# Patient Record
Sex: Female | Born: 1960 | Race: White | Hispanic: No | Marital: Single | Smoking: Never smoker
Health system: Southern US, Community
[De-identification: ages and names within clinical notes are randomized; demographics above are authoritative.]

---

## 1999-04-17 ENCOUNTER — Other Ambulatory Visit: Admission: RE | Admit: 1999-04-17 | Discharge: 1999-04-17 | Payer: Self-pay | Admitting: *Deleted

## 2000-04-01 ENCOUNTER — Other Ambulatory Visit: Admission: RE | Admit: 2000-04-01 | Discharge: 2000-04-01 | Payer: Self-pay | Admitting: Orthopedic Surgery

## 2000-07-07 ENCOUNTER — Ambulatory Visit (HOSPITAL_COMMUNITY): Admission: RE | Admit: 2000-07-07 | Discharge: 2000-07-07 | Payer: Self-pay | Admitting: Neurosurgery

## 2000-07-07 ENCOUNTER — Encounter: Payer: Self-pay | Admitting: Neurosurgery

## 2001-06-21 ENCOUNTER — Other Ambulatory Visit: Admission: RE | Admit: 2001-06-21 | Discharge: 2001-06-21 | Payer: Self-pay | Admitting: Obstetrics & Gynecology

## 2003-01-17 ENCOUNTER — Encounter: Payer: Self-pay | Admitting: Obstetrics & Gynecology

## 2003-01-17 ENCOUNTER — Encounter: Admission: RE | Admit: 2003-01-17 | Discharge: 2003-01-17 | Payer: Self-pay | Admitting: Obstetrics & Gynecology

## 2003-01-31 ENCOUNTER — Other Ambulatory Visit: Admission: RE | Admit: 2003-01-31 | Discharge: 2003-01-31 | Payer: Self-pay | Admitting: Obstetrics & Gynecology

## 2004-03-18 ENCOUNTER — Encounter: Admission: RE | Admit: 2004-03-18 | Discharge: 2004-03-18 | Payer: Self-pay | Admitting: Obstetrics & Gynecology

## 2005-05-14 ENCOUNTER — Encounter: Admission: RE | Admit: 2005-05-14 | Discharge: 2005-05-14 | Payer: Self-pay | Admitting: Obstetrics & Gynecology

## 2006-05-17 ENCOUNTER — Encounter: Admission: RE | Admit: 2006-05-17 | Discharge: 2006-05-17 | Payer: Self-pay | Admitting: Obstetrics & Gynecology

## 2006-07-12 HISTORY — PX: BACK SURGERY: SHX140

## 2007-05-22 ENCOUNTER — Encounter: Admission: RE | Admit: 2007-05-22 | Discharge: 2007-05-22 | Payer: Self-pay | Admitting: Obstetrics & Gynecology

## 2008-05-24 ENCOUNTER — Encounter: Admission: RE | Admit: 2008-05-24 | Discharge: 2008-05-24 | Payer: Self-pay | Admitting: Obstetrics & Gynecology

## 2009-05-28 ENCOUNTER — Encounter: Admission: RE | Admit: 2009-05-28 | Discharge: 2009-05-28 | Payer: Self-pay | Admitting: Obstetrics & Gynecology

## 2010-06-15 ENCOUNTER — Encounter: Admission: RE | Admit: 2010-06-15 | Discharge: 2010-06-15 | Payer: Self-pay | Admitting: Obstetrics & Gynecology

## 2010-11-27 NOTE — Op Note (Signed)
[place]  Patient:    Kendra Foster, Kendra Foster                          MRN: [id number] Proc. Date: 07/07/00 Adm. Date:  [id number] Attending:  Gerald Dexter                           Operative Report  PREOPERATIVE DIAGNOSIS:  Herniated disk, L5-S1 left.  POSTOPERATIVE DIAGNOSIS:  Herniated disk, L5-S1 left.  OPERATION PERFORMED:  Left L5-S1 interlaminar laminotomy for excision of herniated disk with the operating microscope.  SECONDARY PROCEDURE:  Microdissection of L5-S1 disk and S1 nerve root.  SURGEON:  Reinaldo Meeker, M.D.  ASSISTANT:  Donzetta Sprung. Roney Jaffe., M.D.  ANESTHESIA:  DESCRIPTION OF PROCEDURE:  After being placed in prone position, the patients back was prepped and draped in the usual sterile fashion.  A localizing x-ray was taken prior incision to identify the L5-S1 level.  A midline incision made above the spinous processes of L5 and S1.  Using Bovie cutting current the incision was carried down to the spinous processes.  Subperiosteal dissection was then carried out along the left-sided spinous processes of the lamina and the McCullough self-retaining retractor was placed for exposure.  A second x-ray was taken to confirm approach to the L5-S1 level and this was correct. Using a high speed drill the inferior one third of the L5 lamina and medial one third of the facet joint were removed.  Drill was then used for the superior one third of the S2 lamina.  Residual bone and ligamentum flavum were removed in a piece meal fashion.  The microscope was draped and brought into the field and used for the remainder of the case.  Using microdissection technique, the lateral ____________ thecal sac and S1 nerve root were identified.  Further coagulation was carried out down to the floor of the canal to identify the L5-S1 disk which was found to be markedly herniated. After coagulating the annulus, the annulus was incised with a 15 blade.   Using pituitary rongeurs and curets, a very thorough disk space clean out was carried out.  At the same time great care was taken to avoid injury to the neural elements and this was successfully done.  When all the disk material had been removed that could be removed, inspection was carried out in all direction for any evidence of residual compression and none could be identified.  Large amounts of irrigation were carried out and any bleeding controlled with bipolar coagulation and Gelfoam.  The wound was then closed using interrupted Vicryl in the muscle and fascia, subcutaneous and subcuticular tissues and staples on the skin.  A sterile dressing was then applied.  Patient was extubated and taken to the recovery room in stable condition. DD:  07/07/00 TD:  07/07/00 Job: [id number]

## 2011-05-26 ENCOUNTER — Other Ambulatory Visit: Payer: Self-pay | Admitting: Obstetrics & Gynecology

## 2011-05-26 DIAGNOSIS — Z1231 Encounter for screening mammogram for malignant neoplasm of breast: Secondary | ICD-10-CM

## 2011-06-17 ENCOUNTER — Ambulatory Visit
Admission: RE | Admit: 2011-06-17 | Discharge: 2011-06-17 | Disposition: A | Discharge status: Home / Self Care | Payer: PRIVATE HEALTH INSURANCE | Source: Ambulatory Visit | Attending: Obstetrics & Gynecology | Admitting: Obstetrics & Gynecology

## 2011-06-17 DIAGNOSIS — Z1231 Encounter for screening mammogram for malignant neoplasm of breast: Secondary | ICD-10-CM

## 2012-05-26 ENCOUNTER — Other Ambulatory Visit: Payer: Self-pay | Admitting: Obstetrics & Gynecology

## 2012-05-26 DIAGNOSIS — Z1231 Encounter for screening mammogram for malignant neoplasm of breast: Secondary | ICD-10-CM

## 2012-05-30 ENCOUNTER — Ambulatory Visit
Admission: RE | Admit: 2012-05-30 | Discharge: 2012-05-30 | Disposition: A | Discharge status: Home / Self Care | Payer: PRIVATE HEALTH INSURANCE | Source: Ambulatory Visit | Attending: Obstetrics & Gynecology | Admitting: Obstetrics & Gynecology

## 2012-05-30 DIAGNOSIS — Z1231 Encounter for screening mammogram for malignant neoplasm of breast: Secondary | ICD-10-CM

## 2012-07-19 ENCOUNTER — Other Ambulatory Visit: Payer: Self-pay | Admitting: Obstetrics & Gynecology

## 2012-07-19 DIAGNOSIS — Z1231 Encounter for screening mammogram for malignant neoplasm of breast: Secondary | ICD-10-CM

## 2012-07-24 ENCOUNTER — Ambulatory Visit
Admission: RE | Admit: 2012-07-24 | Discharge: 2012-07-24 | Disposition: A | Discharge status: Home / Self Care | Payer: PRIVATE HEALTH INSURANCE | Source: Ambulatory Visit | Attending: Obstetrics & Gynecology | Admitting: Obstetrics & Gynecology

## 2012-07-24 DIAGNOSIS — Z1231 Encounter for screening mammogram for malignant neoplasm of breast: Secondary | ICD-10-CM

## 2013-08-02 ENCOUNTER — Other Ambulatory Visit: Payer: Self-pay

## 2013-08-02 DIAGNOSIS — Z1231 Encounter for screening mammogram for malignant neoplasm of breast: Secondary | ICD-10-CM

## 2013-08-20 ENCOUNTER — Ambulatory Visit
Admission: RE | Admit: 2013-08-20 | Discharge: 2013-08-20 | Disposition: A | Discharge status: Home / Self Care | Payer: 59 | Source: Ambulatory Visit

## 2013-08-20 DIAGNOSIS — Z1231 Encounter for screening mammogram for malignant neoplasm of breast: Secondary | ICD-10-CM

## 2015-06-11 ENCOUNTER — Other Ambulatory Visit: Payer: Self-pay

## 2015-06-11 DIAGNOSIS — Z1231 Encounter for screening mammogram for malignant neoplasm of breast: Secondary | ICD-10-CM

## 2015-06-26 ENCOUNTER — Ambulatory Visit
Admission: RE | Admit: 2015-06-26 | Discharge: 2015-06-26 | Disposition: A | Discharge status: Home / Self Care | Payer: 59 | Source: Ambulatory Visit

## 2015-06-26 DIAGNOSIS — Z1231 Encounter for screening mammogram for malignant neoplasm of breast: Secondary | ICD-10-CM

## 2016-01-21 DIAGNOSIS — N3091 Cystitis, unspecified with hematuria: Secondary | ICD-10-CM | POA: Diagnosis not present

## 2016-01-21 DIAGNOSIS — N3090 Cystitis, unspecified without hematuria: Secondary | ICD-10-CM | POA: Diagnosis not present

## 2016-03-20 DIAGNOSIS — H1011 Acute atopic conjunctivitis, right eye: Secondary | ICD-10-CM | POA: Diagnosis not present

## 2016-03-20 DIAGNOSIS — J309 Allergic rhinitis, unspecified: Secondary | ICD-10-CM | POA: Diagnosis not present

## 2016-07-13 DIAGNOSIS — R300 Dysuria: Secondary | ICD-10-CM | POA: Diagnosis not present

## 2016-07-13 DIAGNOSIS — N3090 Cystitis, unspecified without hematuria: Secondary | ICD-10-CM | POA: Diagnosis not present

## 2016-08-04 DIAGNOSIS — J0100 Acute maxillary sinusitis, unspecified: Secondary | ICD-10-CM | POA: Diagnosis not present

## 2016-08-12 DIAGNOSIS — H04123 Dry eye syndrome of bilateral lacrimal glands: Secondary | ICD-10-CM | POA: Diagnosis not present

## 2016-09-23 DIAGNOSIS — H5213 Myopia, bilateral: Secondary | ICD-10-CM | POA: Diagnosis not present

## 2016-09-23 DIAGNOSIS — H04123 Dry eye syndrome of bilateral lacrimal glands: Secondary | ICD-10-CM | POA: Diagnosis not present

## 2017-01-26 DIAGNOSIS — S90862A Insect bite (nonvenomous), left foot, initial encounter: Secondary | ICD-10-CM | POA: Diagnosis not present

## 2017-02-24 DIAGNOSIS — J0100 Acute maxillary sinusitis, unspecified: Secondary | ICD-10-CM | POA: Diagnosis not present

## 2017-03-15 ENCOUNTER — Other Ambulatory Visit: Payer: Self-pay | Admitting: Obstetrics & Gynecology

## 2017-03-15 DIAGNOSIS — Z1231 Encounter for screening mammogram for malignant neoplasm of breast: Secondary | ICD-10-CM

## 2017-04-04 ENCOUNTER — Ambulatory Visit
Admission: RE | Admit: 2017-04-04 | Discharge: 2017-04-04 | Disposition: A | Discharge status: Home / Self Care | Payer: BLUE CROSS/BLUE SHIELD | Source: Ambulatory Visit | Attending: Obstetrics & Gynecology | Admitting: Obstetrics & Gynecology

## 2017-04-04 DIAGNOSIS — Z1231 Encounter for screening mammogram for malignant neoplasm of breast: Secondary | ICD-10-CM | POA: Diagnosis not present

## 2017-04-27 ENCOUNTER — Ambulatory Visit (INDEPENDENT_AMBULATORY_CARE_PROVIDER_SITE_OTHER): Payer: BLUE CROSS/BLUE SHIELD | Admitting: Obstetrics & Gynecology

## 2017-04-27 ENCOUNTER — Encounter: Payer: Self-pay | Admitting: Obstetrics & Gynecology

## 2017-04-27 VITALS — BP 132/86 | Ht 68.5 in | Wt 178.0 lb

## 2017-04-27 DIAGNOSIS — Z01419 Encounter for gynecological examination (general) (routine) without abnormal findings: Secondary | ICD-10-CM

## 2017-04-27 DIAGNOSIS — N914 Secondary oligomenorrhea: Secondary | ICD-10-CM | POA: Diagnosis not present

## 2017-04-27 DIAGNOSIS — N951 Menopausal and female climacteric states: Secondary | ICD-10-CM | POA: Diagnosis not present

## 2017-04-27 NOTE — Progress Notes (Signed)
Kendra Foster [birth year] [id number]   History:    56 y.o. G0 Single.  Currently abstinent.  RP:  Established patient presenting for annual gyn exam  HPI:  No menses x 1 year, then had a 5 day menstrual like period 03/24/2017.  Occasional spotting x 1 day.  Pelvic discomfort.  No hot flushes/night sweats currently.  Normal vaginal secretions.  Breasts wnl.  Mictions/BMs wnl.  BMI 26.67.  Past medical history,surgical history, family history and social history were all reviewed and documented in the EPIC chart.  Gynecologic History Patient's last menstrual period was 03/24/2017. Contraception: abstinence Last Pap: 2017. Results were: normal Last mammogram: 2018. Results were: normal Colono never  Obstetric History OB History  Gravida Para Term Preterm AB Living  0 0 0 0 0 0  SAB TAB Ectopic Multiple Live Births  0 0 0 0 0         ROS: A ROS was performed and pertinent positives and negatives are included in the history.  GENERAL: No fevers or chills. HEENT: No change in vision, no earache, sore throat or sinus congestion. NECK: No pain or stiffness. CARDIOVASCULAR: No chest pain or pressure. No palpitations. PULMONARY: No shortness of breath, cough or wheeze. GASTROINTESTINAL: No abdominal pain, nausea, vomiting or diarrhea, melena or bright red blood per rectum. GENITOURINARY: No urinary frequency, urgency, hesitancy or dysuria. MUSCULOSKELETAL: No joint or muscle pain, no back pain, no recent trauma. DERMATOLOGIC: No rash, no itching, no lesions. ENDOCRINE: No polyuria, polydipsia, no heat or cold intolerance. No recent change in weight. HEMATOLOGICAL: No anemia or easy bruising or bleeding. NEUROLOGIC: No headache, seizures, numbness, tingling or weakness. PSYCHIATRIC: No depression, no loss of interest in normal activity or change in sleep pattern.     Exam:   BP 132/86   Ht 5' 8.5" (1.74 m)   Wt 178 lb (80.7 kg)   LMP 03/24/2017   BMI 26.67 kg/m   Body mass index is  26.67 kg/m.  General appearance : Well developed well nourished female. No acute distress HEENT: Eyes: no retinal hemorrhage or exudates,  Neck supple, trachea midline, no carotid bruits, no thyroidmegaly Lungs: Clear to auscultation, no rhonchi or wheezes, or rib retractions  Heart: Regular rate and rhythm, no murmurs or gallops Breast:Examined in sitting and supine position were symmetrical in appearance, no palpable masses or tenderness,  no skin retraction, no nipple inversion, no nipple discharge, no skin discoloration, no axillary or supraclavicular lymphadenopathy Abdomen: no palpable masses or tenderness, no rebound or guarding Extremities: no edema or skin discoloration or tenderness  Pelvic: Vulva normal  Bartholin, Urethra, Skene Glands: Within normal limits             Vagina: No gross lesions or discharge  Cervix: No gross lesions or discharge.  Pap reflex done.  Uterus  AV, normal size, shape and consistency, non-tender and mobile  Adnexa  Without masses or tenderness  Anus and perineum  normal    Assessment/Plan:  56 y.o. female for annual exam   1. Encounter for routine gynecological examination with Papanicolaou smear of cervix Normal gyn exam.  Pap reflex done.  Breasts wnl.  Mammo normal 2018.  2. Secondary oligomenorrhea Perimenopause or PMB.  F/U Pelvic US to r/o Endometrial pathology/Possible EBx. - US Transvaginal Non-OB; Future  3. Perimenopause Probable Perimenopause.  Check FSH level and f/u Pelvic US for Endometrial line evaluation. - FSH - US Transvaginal Non-OB; Future  Counseling on above issues >50% x 10 minutes.  Princess Bruins MD, 12:17 PM 04/27/2017

## 2017-04-28 LAB — PAP IG W/ RFLX HPV ASCU

## 2017-04-28 LAB — FOLLICLE STIMULATING HORMONE: FSH: 84.3 m[IU]/mL

## 2017-05-01 NOTE — Patient Instructions (Addendum)
1. Encounter for routine gynecological examination with Papanicolaou smear of cervix Normal gyn exam.  Pap reflex done.  Breasts wnl.  Mammo normal 2018.  2. Secondary oligomenorrhea Perimenopause or PMB.  F/U Pelvic US to r/o Endometrial pathology/Possible EBx. - US Transvaginal Non-OB; Future  3. Perimenopause Probable Perimenopause.  Check FSH level and f/u Pelvic US for Endometrial line evaluation. - FSH - US Transvaginal Non-OB; Future  Kendra Foster, it was a pleasure to see you today!  I will inform you of your results as soon as available and see you again soon for your pelvic US.   Health Maintenance for Postmenopausal Women Menopause is a normal process in which your reproductive ability comes to an end. This process happens gradually over a span of months to years, usually between the ages of 64 and 20. Menopause is complete when you have missed 12 consecutive menstrual periods. It is important to talk with your health care provider about some of the most common conditions that affect postmenopausal women, such as heart disease, cancer, and bone loss (osteoporosis). Adopting a healthy lifestyle and getting preventive care can help to promote your health and wellness. Those actions can also lower your chances of developing some of these common conditions. What should I know about menopause? During menopause, you may experience a number of symptoms, such as:  Moderate-to-severe hot flashes.  Night sweats.  Decrease in sex drive.  Mood swings.  Headaches.  Tiredness.  Irritability.  Memory problems.  Insomnia.  Choosing to treat or not to treat menopausal changes is an individual decision that you make with your health care provider. What should I know about hormone replacement therapy and supplements? Hormone therapy products are effective for treating symptoms that are associated with menopause, such as hot flashes and night sweats. Hormone replacement carries certain  risks, especially as you become older. If you are thinking about using estrogen or estrogen with progestin treatments, discuss the benefits and risks with your health care provider. What should I know about heart disease and stroke? Heart disease, heart attack, and stroke become more likely as you age. This may be due, in part, to the hormonal changes that your body experiences during menopause. These can affect how your body processes dietary fats, triglycerides, and cholesterol. Heart attack and stroke are both medical emergencies. There are many things that you can do to help prevent heart disease and stroke:  Have your blood pressure checked at least every 1-2 years. High blood pressure causes heart disease and increases the risk of stroke.  If you are 93-72 years old, ask your health care provider if you should take aspirin to prevent a heart attack or a stroke.  Do not use any tobacco products, including cigarettes, chewing tobacco, or electronic cigarettes. If you need help quitting, ask your health care provider.  It is important to eat a healthy diet and maintain a healthy weight. ? Be sure to include plenty of vegetables, fruits, low-fat dairy products, and lean protein. ? Avoid eating foods that are high in solid fats, added sugars, or salt (sodium).  Get regular exercise. This is one of the most important things that you can do for your health. ? Try to exercise for at least 150 minutes each week. The type of exercise that you do should increase your heart rate and make you sweat. This is known as moderate-intensity exercise. ? Try to do strengthening exercises at least twice each week. Do these in addition to the moderate-intensity exercise.  Know your numbers.Ask your health care provider to check your cholesterol and your blood glucose. Continue to have your blood tested as directed by your health care provider.  What should I know about cancer screening? There are several types  of cancer. Take the following steps to reduce your risk and to catch any cancer development as early as possible. Breast Cancer  Practice breast self-awareness. ? This means understanding how your breasts normally appear and feel. ? It also means doing regular breast self-exams. Let your health care provider know about any changes, no matter how small.  If you are 51 or older, have a clinician do a breast exam (clinical breast exam or CBE) every year. Depending on your age, family history, and medical history, it may be recommended that you also have a yearly breast X-ray (mammogram).  If you have a family history of breast cancer, talk with your health care provider about genetic screening.  If you are at high risk for breast cancer, talk with your health care provider about having an MRI and a mammogram every year.  Breast cancer (BRCA) gene test is recommended for women who have family members with BRCA-related cancers. Results of the assessment will determine the need for genetic counseling and BRCA1 and for BRCA2 testing. BRCA-related cancers include these types: ? Breast. This occurs in males or females. ? Ovarian. ? Tubal. This may also be called fallopian tube cancer. ? Cancer of the abdominal or pelvic lining (peritoneal cancer). ? Prostate. ? Pancreatic.  Cervical, Uterine, and Ovarian Cancer Your health care provider may recommend that you be screened regularly for cancer of the pelvic organs. These include your ovaries, uterus, and vagina. This screening involves a pelvic exam, which includes checking for microscopic changes to the surface of your cervix (Pap test).  For women ages 21-65, health care providers may recommend a pelvic exam and a Pap test every three years. For women ages 63-65, they may recommend the Pap test and pelvic exam, combined with testing for human papilloma virus (HPV), every five years. Some types of HPV increase your risk of cervical cancer. Testing for  HPV may also be done on women of any age who have unclear Pap test results.  Other health care providers may not recommend any screening for nonpregnant women who are considered low risk for pelvic cancer and have no symptoms. Ask your health care provider if a screening pelvic exam is right for you.  If you have had past treatment for cervical cancer or a condition that could lead to cancer, you need Pap tests and screening for cancer for at least 20 years after your treatment. If Pap tests have been discontinued for you, your risk factors (such as having a new sexual partner) need to be reassessed to determine if you should start having screenings again. Some women have medical problems that increase the chance of getting cervical cancer. In these cases, your health care provider may recommend that you have screening and Pap tests more often.  If you have a family history of uterine cancer or ovarian cancer, talk with your health care provider about genetic screening.  If you have vaginal bleeding after reaching menopause, tell your health care provider.  There are currently no reliable tests available to screen for ovarian cancer.  Lung Cancer Lung cancer screening is recommended for adults 72-24 years old who are at high risk for lung cancer because of a history of smoking. A yearly low-dose CT scan of the  lungs is recommended if you:  Currently smoke.  Have a history of at least 30 pack-years of smoking and you currently smoke or have quit within the past 15 years. A pack-year is smoking an average of one pack of cigarettes per day for one year.  Yearly screening should:  Continue until it has been 15 years since you quit.  Stop if you develop a health problem that would prevent you from having lung cancer treatment.  Colorectal Cancer  This type of cancer can be detected and can often be prevented.  Routine colorectal cancer screening usually begins at age 20 and continues through  age 62.  If you have risk factors for colon cancer, your health care provider may recommend that you be screened at an earlier age.  If you have a family history of colorectal cancer, talk with your health care provider about genetic screening.  Your health care provider may also recommend using home test kits to check for hidden blood in your stool.  A small camera at the end of a tube can be used to examine your colon directly (sigmoidoscopy or colonoscopy). This is done to check for the earliest forms of colorectal cancer.  Direct examination of the colon should be repeated every 5-10 years until age 5. However, if early forms of precancerous polyps or small growths are found or if you have a family history or genetic risk for colorectal cancer, you may need to be screened more often.  Skin Cancer  Check your skin from head to toe regularly.  Monitor any moles. Be sure to tell your health care provider: ? About any new moles or changes in moles, especially if there is a change in a mole's shape or color. ? If you have a mole that is larger than the size of a pencil eraser.  If any of your family members has a history of skin cancer, especially at a young age, talk with your health care provider about genetic screening.  Always use sunscreen. Apply sunscreen liberally and repeatedly throughout the day.  Whenever you are outside, protect yourself by wearing long sleeves, pants, a wide-brimmed hat, and sunglasses.  What should I know about osteoporosis? Osteoporosis is a condition in which bone destruction happens more quickly than new bone creation. After menopause, you may be at an increased risk for osteoporosis. To help prevent osteoporosis or the bone fractures that can happen because of osteoporosis, the following is recommended:  If you are 41-31 years old, get at least 1,000 mg of calcium and at least 600 mg of vitamin D per day.  If you are older than age 43 but younger than  age 44, get at least 1,200 mg of calcium and at least 600 mg of vitamin D per day.  If you are older than age 11, get at least 1,200 mg of calcium and at least 800 mg of vitamin D per day.  Smoking and excessive alcohol intake increase the risk of osteoporosis. Eat foods that are rich in calcium and vitamin D, and do weight-bearing exercises several times each week as directed by your health care provider. What should I know about how menopause affects my mental health? Depression may occur at any age, but it is more common as you become older. Common symptoms of depression include:  Low or sad mood.  Changes in sleep patterns.  Changes in appetite or eating patterns.  Feeling an overall lack of motivation or enjoyment of activities that you previously  enjoyed.  Frequent crying spells.  Talk with your health care provider if you think that you are experiencing depression. What should I know about immunizations? It is important that you get and maintain your immunizations. These include:  Tetanus, diphtheria, and pertussis (Tdap) booster vaccine.  Influenza every year before the flu season begins.  Pneumonia vaccine.  Shingles vaccine.  Your health care provider may also recommend other immunizations. This information is not intended to replace advice given to you by your health care provider. Make sure you discuss any questions you have with your health care provider. Document Released: 08/20/2005 Document Revised: 01/16/2016 Document Reviewed: 04/01/2015 Elsevier Interactive Patient Education  2018 [employer].

## 2017-05-02 ENCOUNTER — Telehealth: Payer: Self-pay | Admitting: *Deleted

## 2017-05-02 NOTE — Telephone Encounter (Signed)
-----   Message from Princess Bruins, MD sent at 04/27/2017 12:33 PM EDT ----- Regarding: Screening Colono Never done.  Schedule screening Colono with Dr Collene Mares if takes her insurance.

## 2017-05-02 NOTE — Telephone Encounter (Signed)
Notes faxed to Dr.Mann office they will contact pt to schedule and fax me back with time and date.

## 2017-05-04 NOTE — Telephone Encounter (Signed)
Left a detailed message on pt voicemail with time and date for appointment per DPR access 05/10/17 @ 2:45pm

## 2017-05-11 ENCOUNTER — Other Ambulatory Visit: Payer: Self-pay | Admitting: Obstetrics & Gynecology

## 2017-05-11 ENCOUNTER — Ambulatory Visit (INDEPENDENT_AMBULATORY_CARE_PROVIDER_SITE_OTHER): Payer: BLUE CROSS/BLUE SHIELD | Admitting: Obstetrics & Gynecology

## 2017-05-11 ENCOUNTER — Ambulatory Visit (INDEPENDENT_AMBULATORY_CARE_PROVIDER_SITE_OTHER): Payer: BLUE CROSS/BLUE SHIELD

## 2017-05-11 DIAGNOSIS — N951 Menopausal and female climacteric states: Secondary | ICD-10-CM

## 2017-05-11 DIAGNOSIS — N950 Postmenopausal bleeding: Secondary | ICD-10-CM | POA: Diagnosis not present

## 2017-05-11 DIAGNOSIS — N914 Secondary oligomenorrhea: Secondary | ICD-10-CM | POA: Diagnosis not present

## 2017-05-11 DIAGNOSIS — D251 Intramural leiomyoma of uterus: Secondary | ICD-10-CM | POA: Diagnosis not present

## 2017-05-11 NOTE — Patient Instructions (Signed)
1. Postmenopausal bleeding Endometrial line thin at 2.0 mm.  Patient reassured.  Uterus/Ovaries wnl.  Will observe, very little symptoms of menopause, declines HRT at this time.  Akiva, good seeing you today!

## 2017-05-11 NOTE — Progress Notes (Signed)
    Kendra Foster October 19, 1960 [id number]        56 y.o.  G0  RP:  PMB for Pelvic US  HPI:  No further bleeding x 03/24/2017.  No pelvic pain.  [facility] last visit on 04/27/2017 at 56.  Past medical history,surgical history, problem list, medications, allergies, family history and social history were all reviewed and documented in the EPIC chart.  Directed ROS with pertinent positives and negatives documented in the history of present illness/assessment and plan.  Exam:  There were no vitals filed for this visit. General appearance:  Normal  Pelvic US today:  T/V Anteverted uterus with heterogeneous myometrium.  Endometrial line at 2.0 mm.  Intramural Fibroids 1.4 x 1.2 cm and 1.6 x 1.4 cm.  Right ovary normal.  Left ovary normal by T/A images.  No FF in CDS.   Assessment/Plan:  56 y.o. G0P0000   1. Postmenopausal bleeding Endometrial line thin at 2.0 mm.  Patient reassured.  Uterus/Ovaries wnl.  Will observe, very little symptoms of menopause, declines HRT at this time.  Counseling on above issues >50% x 15 minutes  Princess Bruins MD, 2:51 PM 05/11/2017

## 2017-07-14 DIAGNOSIS — J0100 Acute maxillary sinusitis, unspecified: Secondary | ICD-10-CM | POA: Diagnosis not present

## 2017-11-21 DIAGNOSIS — J324 Chronic pansinusitis: Secondary | ICD-10-CM | POA: Diagnosis not present

## 2018-05-24 ENCOUNTER — Other Ambulatory Visit: Payer: Self-pay | Admitting: Obstetrics & Gynecology

## 2018-05-24 DIAGNOSIS — Z1231 Encounter for screening mammogram for malignant neoplasm of breast: Secondary | ICD-10-CM

## 2018-05-25 DIAGNOSIS — Z13220 Encounter for screening for lipoid disorders: Secondary | ICD-10-CM | POA: Diagnosis not present

## 2018-05-25 DIAGNOSIS — Z0000 Encounter for general adult medical examination without abnormal findings: Secondary | ICD-10-CM | POA: Diagnosis not present

## 2018-05-25 DIAGNOSIS — Z6828 Body mass index (BMI) 28.0-28.9, adult: Secondary | ICD-10-CM | POA: Diagnosis not present

## 2018-05-25 DIAGNOSIS — Z23 Encounter for immunization: Secondary | ICD-10-CM | POA: Diagnosis not present

## 2018-07-17 ENCOUNTER — Ambulatory Visit
Admission: RE | Admit: 2018-07-17 | Discharge: 2018-07-17 | Disposition: A | Discharge status: Home / Self Care | Payer: BLUE CROSS/BLUE SHIELD | Source: Ambulatory Visit | Attending: Obstetrics & Gynecology | Admitting: Obstetrics & Gynecology

## 2018-07-17 DIAGNOSIS — Z1231 Encounter for screening mammogram for malignant neoplasm of breast: Secondary | ICD-10-CM

## 2018-08-07 DIAGNOSIS — J0100 Acute maxillary sinusitis, unspecified: Secondary | ICD-10-CM | POA: Diagnosis not present

## 2018-10-20 DIAGNOSIS — N3090 Cystitis, unspecified without hematuria: Secondary | ICD-10-CM | POA: Diagnosis not present

## 2018-10-20 DIAGNOSIS — N3001 Acute cystitis with hematuria: Secondary | ICD-10-CM | POA: Diagnosis not present

## 2018-12-25 DIAGNOSIS — L255 Unspecified contact dermatitis due to plants, except food: Secondary | ICD-10-CM | POA: Diagnosis not present

## 2019-01-01 DIAGNOSIS — L255 Unspecified contact dermatitis due to plants, except food: Secondary | ICD-10-CM | POA: Diagnosis not present

## 2019-02-06 DIAGNOSIS — N3001 Acute cystitis with hematuria: Secondary | ICD-10-CM | POA: Diagnosis not present

## 2019-02-06 DIAGNOSIS — R300 Dysuria: Secondary | ICD-10-CM | POA: Diagnosis not present

## 2019-02-21 DIAGNOSIS — H5213 Myopia, bilateral: Secondary | ICD-10-CM | POA: Diagnosis not present

## 2019-04-18 DIAGNOSIS — R05 Cough: Secondary | ICD-10-CM | POA: Diagnosis not present

## 2019-04-18 DIAGNOSIS — Z20828 Contact with and (suspected) exposure to other viral communicable diseases: Secondary | ICD-10-CM | POA: Diagnosis not present

## 2019-04-26 DIAGNOSIS — N3091 Cystitis, unspecified with hematuria: Secondary | ICD-10-CM | POA: Diagnosis not present

## 2019-04-26 DIAGNOSIS — N3000 Acute cystitis without hematuria: Secondary | ICD-10-CM | POA: Diagnosis not present

## 2019-05-22 ENCOUNTER — Other Ambulatory Visit: Payer: Self-pay

## 2019-05-23 ENCOUNTER — Encounter: Payer: Self-pay | Admitting: Obstetrics & Gynecology

## 2019-05-23 ENCOUNTER — Ambulatory Visit (INDEPENDENT_AMBULATORY_CARE_PROVIDER_SITE_OTHER): Payer: BC Managed Care – PPO | Admitting: Obstetrics & Gynecology

## 2019-05-23 VITALS — BP 120/70 | Ht 68.5 in | Wt 165.0 lb

## 2019-05-23 DIAGNOSIS — Z780 Asymptomatic menopausal state: Secondary | ICD-10-CM | POA: Diagnosis not present

## 2019-05-23 DIAGNOSIS — E559 Vitamin D deficiency, unspecified: Secondary | ICD-10-CM | POA: Diagnosis not present

## 2019-05-23 DIAGNOSIS — E7800 Pure hypercholesterolemia, unspecified: Secondary | ICD-10-CM | POA: Diagnosis not present

## 2019-05-23 DIAGNOSIS — E785 Hyperlipidemia, unspecified: Secondary | ICD-10-CM | POA: Diagnosis not present

## 2019-05-23 DIAGNOSIS — Z01419 Encounter for gynecological examination (general) (routine) without abnormal findings: Secondary | ICD-10-CM

## 2019-05-23 NOTE — Progress Notes (Signed)
Kendra Foster 04-28-1961 [id number]   History:    58 y.o. G0 Single  RP:  Established patient presenting for annual gyn exam   HPI: Postmenopausal well on no hormone replacement therapy.  No postmenopausal bleeding for the past 2 years.  No pelvic pain.  Currently abstinent.  Urine and bowel movements normal.  Breasts normal.  Lost 23 Lbs x last visit 2 yrs ago (real wt then per patient was 188).  Body mass index now at 24.72.  Improved fitness with walking and low calorie/carb diet.  Will do fasting health labs here today.  Past medical history,surgical history, family history and social history were all reviewed and documented in the EPIC chart.  Gynecologic History Patient's last menstrual period was 03/24/2017. Contraception: abstinence and post menopausal status Last Pap: 04/2017.  Results were: Negative Last mammogram: 07/2018. Results were: Negative Bone Density: Never Colonoscopy: Never, declines.  Obstetric History OB History  Gravida Para Term Preterm AB Living  0 0 0 0 0 0  SAB TAB Ectopic Multiple Live Births  0 0 0 0 0     ROS: A ROS was performed and pertinent positives and negatives are included in the history.  GENERAL: No fevers or chills. HEENT: No change in vision, no earache, sore throat or sinus congestion. NECK: No pain or stiffness. CARDIOVASCULAR: No chest pain or pressure. No palpitations. PULMONARY: No shortness of breath, cough or wheeze. GASTROINTESTINAL: No abdominal pain, nausea, vomiting or diarrhea, melena or bright red blood per rectum. GENITOURINARY: No urinary frequency, urgency, hesitancy or dysuria. MUSCULOSKELETAL: No joint or muscle pain, no back pain, no recent trauma. DERMATOLOGIC: No rash, no itching, no lesions. ENDOCRINE: No polyuria, polydipsia, no heat or cold intolerance. No recent change in weight. HEMATOLOGICAL: No anemia or easy bruising or bleeding. NEUROLOGIC: No headache, seizures, numbness, tingling or weakness. PSYCHIATRIC: No  depression, no loss of interest in normal activity or change in sleep pattern.     Exam:   BP 120/70   Ht 5' 8.5" (1.74 m)   Wt 165 lb (74.8 kg)   LMP 03/24/2017   BMI 24.72 kg/m   Body mass index is 24.72 kg/m.  General appearance : Well developed well nourished female. No acute distress HEENT: Eyes: no retinal hemorrhage or exudates,  Neck supple, trachea midline, no carotid bruits, no thyroidmegaly Lungs: Clear to auscultation, no rhonchi or wheezes, or rib retractions  Heart: Regular rate and rhythm, no murmurs or gallops Breast:Examined in sitting and supine position were symmetrical in appearance, no palpable masses or tenderness,  no skin retraction, no nipple inversion, no nipple discharge, no skin discoloration, no axillary or supraclavicular lymphadenopathy Abdomen: no palpable masses or tenderness, no rebound or guarding Extremities: no edema or skin discoloration or tenderness  Pelvic: Vulva: Normal             Vagina: No gross lesions or discharge  Cervix: No gross lesions or discharge.  Pap reflex done.  Uterus  AV, normal size, shape and consistency, non-tender and mobile  Adnexa  Without masses or tenderness  Anus: Normal   Assessment/Plan:  58 y.o. female for annual exam   1. Encounter for routine gynecological examination with Papanicolaou smear of cervix Normal gynecologic exam in menopause.  Pap reflex done.  Breast exam normal.  Screening mammogram January 2020 was negative.  Declines colonoscopy.  Fasting health labs here today.  Much improved body mass index, currently in the normal range at 24.72.  Continue with fitness and healthy  nutrition. - CBC - Comp Met (CMET) - Lipid panel - TSH - VITAMIN D 25 Hydroxy (Vit-D Deficiency, Fractures)  2. Postmenopause Well on no hormone replacement therapy.  No postmenopausal bleeding.  Recommend vitamin D supplements, calcium intake of 1200 mg daily and regular weightbearing physical activities.  Princess Bruins MD, 10:01 AM 05/23/2019

## 2019-05-23 NOTE — Addendum Note (Signed)
Addended by: Thurnell Garbe A on: 05/23/2019 11:22 AM   Modules accepted: Orders

## 2019-05-23 NOTE — Patient Instructions (Signed)
  1. Encounter for routine gynecological examination with Papanicolaou smear of cervix Normal gynecologic exam in menopause.  Pap reflex done.  Breast exam normal.  Screening mammogram January 2020 was negative.  Declines colonoscopy.  Fasting health labs here today.  Much improved body mass index, currently in the normal range at 24.72.  Continue with fitness and healthy nutrition. - CBC - Comp Met (CMET) - Lipid panel - TSH - VITAMIN D 25 Hydroxy (Vit-D Deficiency, Fractures)  2. Postmenopause Well on no hormone replacement therapy.  No postmenopausal bleeding.  Recommend vitamin D supplements, calcium intake of 1200 mg daily and regular weightbearing physical activities.  Kendra Foster, it was a pleasure seeing you today!  I will inform you of your results as soon as they are available.

## 2019-05-24 LAB — COMPREHENSIVE METABOLIC PANEL
AG Ratio: 1.8 (calc) (ref 1.0–2.5)
ALT: 13 U/L (ref 6–29)
AST: 17 U/L (ref 10–35)
Albumin: 4.2 g/dL (ref 3.6–5.1)
Alkaline phosphatase (APISO): 62 U/L (ref 37–153)
BUN: 12 mg/dL (ref 7–25)
CO2: 31 mmol/L (ref 20–32)
Calcium: 9.6 mg/dL (ref 8.6–10.4)
Chloride: 106 mmol/L (ref 98–110)
Creat: 0.88 mg/dL (ref 0.50–1.05)
Globulin: 2.4 g/dL (calc) (ref 1.9–3.7)
Glucose, Bld: 101 mg/dL — ABNORMAL HIGH (ref 65–99)
Potassium: 5 mmol/L (ref 3.5–5.3)
Sodium: 140 mmol/L (ref 135–146)
Total Bilirubin: 0.5 mg/dL (ref 0.2–1.2)
Total Protein: 6.6 g/dL (ref 6.1–8.1)

## 2019-05-24 LAB — CBC
HCT: 43.8 % (ref 35.0–45.0)
Hemoglobin: 14.8 g/dL (ref 11.7–15.5)
MCH: 31.4 pg (ref 27.0–33.0)
MCHC: 33.8 g/dL (ref 32.0–36.0)
MCV: 92.8 fL (ref 80.0–100.0)
MPV: 10.6 fL (ref 7.5–12.5)
Platelets: 270 10*3/uL (ref 140–400)
RBC: 4.72 10*6/uL (ref 3.80–5.10)
RDW: 12.4 % (ref 11.0–15.0)
WBC: 5.3 10*3/uL (ref 3.8–10.8)

## 2019-05-24 LAB — TSH: TSH: 1.87 mIU/L (ref 0.40–4.50)

## 2019-05-24 LAB — VITAMIN D 25 HYDROXY (VIT D DEFICIENCY, FRACTURES): Vit D, 25-Hydroxy: 23 ng/mL — ABNORMAL LOW (ref 30–100)

## 2019-05-24 LAB — LIPID PANEL
Cholesterol: 234 mg/dL — ABNORMAL HIGH (ref <200)
HDL: 53 mg/dL (ref >=50)
LDL Cholesterol (Calc): 159 mg/dL (calc) — ABNORMAL HIGH
Non-HDL Cholesterol (Calc): 181 mg/dL (calc) — ABNORMAL HIGH (ref <130)
Total CHOL/HDL Ratio: 4.4 (calc) (ref <5.0)
Triglycerides: 107 mg/dL (ref <150)

## 2019-05-24 LAB — PAP IG W/ RFLX HPV ASCU

## 2019-05-25 ENCOUNTER — Other Ambulatory Visit: Payer: Self-pay | Admitting: *Deleted

## 2019-05-25 MED ORDER — VITAMIN D (ERGOCALCIFEROL) 1.25 MG (50000 UNIT) PO CAPS: 50000.0000 [IU] | ORAL_CAPSULE | ORAL | 0 refills | Status: AC

## 2019-08-03 DIAGNOSIS — R05 Cough: Secondary | ICD-10-CM | POA: Diagnosis not present

## 2019-08-03 DIAGNOSIS — R519 Headache, unspecified: Secondary | ICD-10-CM | POA: Diagnosis not present

## 2019-08-09 DIAGNOSIS — R519 Headache, unspecified: Secondary | ICD-10-CM | POA: Diagnosis not present

## 2019-08-13 ENCOUNTER — Other Ambulatory Visit: Payer: Self-pay | Admitting: Obstetrics & Gynecology

## 2019-09-04 DIAGNOSIS — M25531 Pain in right wrist: Secondary | ICD-10-CM | POA: Diagnosis not present

## 2019-09-04 DIAGNOSIS — L989 Disorder of the skin and subcutaneous tissue, unspecified: Secondary | ICD-10-CM | POA: Diagnosis not present

## 2019-09-04 DIAGNOSIS — E782 Mixed hyperlipidemia: Secondary | ICD-10-CM | POA: Diagnosis not present

## 2019-09-04 DIAGNOSIS — M67431 Ganglion, right wrist: Secondary | ICD-10-CM | POA: Diagnosis not present

## 2019-09-04 DIAGNOSIS — E559 Vitamin D deficiency, unspecified: Secondary | ICD-10-CM | POA: Diagnosis not present

## 2019-09-12 DIAGNOSIS — L570 Actinic keratosis: Secondary | ICD-10-CM | POA: Diagnosis not present

## 2019-09-27 DIAGNOSIS — H1045 Other chronic allergic conjunctivitis: Secondary | ICD-10-CM | POA: Diagnosis not present

## 2019-09-27 DIAGNOSIS — H1031 Unspecified acute conjunctivitis, right eye: Secondary | ICD-10-CM | POA: Diagnosis not present

## 2019-11-27 DIAGNOSIS — N3090 Cystitis, unspecified without hematuria: Secondary | ICD-10-CM | POA: Diagnosis not present

## 2019-11-27 DIAGNOSIS — N3001 Acute cystitis with hematuria: Secondary | ICD-10-CM | POA: Diagnosis not present

## 2020-02-28 DIAGNOSIS — N3090 Cystitis, unspecified without hematuria: Secondary | ICD-10-CM | POA: Diagnosis not present

## 2020-02-28 DIAGNOSIS — N3000 Acute cystitis without hematuria: Secondary | ICD-10-CM | POA: Diagnosis not present

## 2020-05-13 ENCOUNTER — Other Ambulatory Visit: Payer: Self-pay | Admitting: Obstetrics & Gynecology

## 2020-05-13 DIAGNOSIS — Z1231 Encounter for screening mammogram for malignant neoplasm of breast: Secondary | ICD-10-CM

## 2020-05-14 ENCOUNTER — Ambulatory Visit
Admission: RE | Admit: 2020-05-14 | Discharge: 2020-05-14 | Disposition: A | Discharge status: Home / Self Care | Payer: BLUE CROSS/BLUE SHIELD | Source: Ambulatory Visit | Attending: Obstetrics & Gynecology | Admitting: Obstetrics & Gynecology

## 2020-05-14 ENCOUNTER — Other Ambulatory Visit: Payer: Self-pay

## 2020-05-14 DIAGNOSIS — Z1231 Encounter for screening mammogram for malignant neoplasm of breast: Secondary | ICD-10-CM

## 2020-06-20 IMAGING — MG DIGITAL SCREENING BILATERAL MAMMOGRAM WITH TOMO AND CAD
8 series · 8 of 24 positions shown · non-contrast
Comparison: Previous exam(s).

CLINICAL DATA: Screening.

EXAM:
DIGITAL SCREENING BILATERAL MAMMOGRAM WITH TOMO AND CAD

[R CC synth-2D]
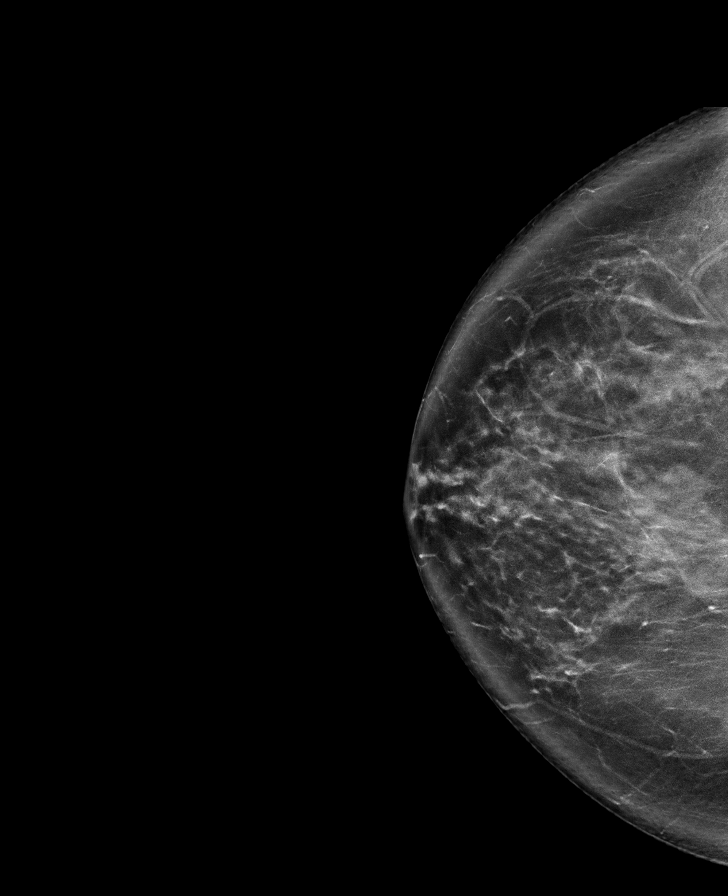

[L CC synth-2D]
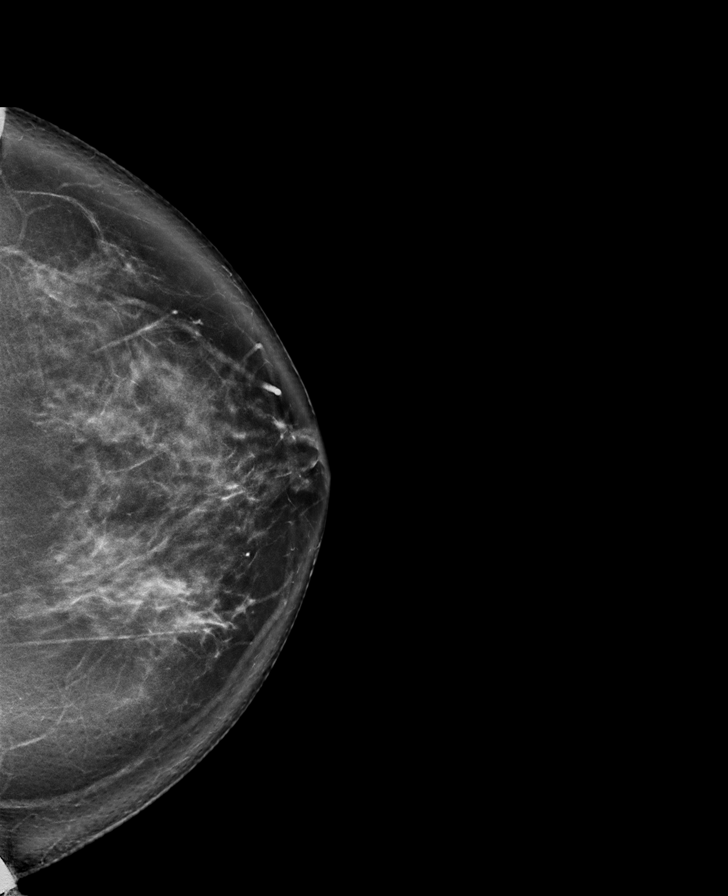

[R MLO synth-2D]
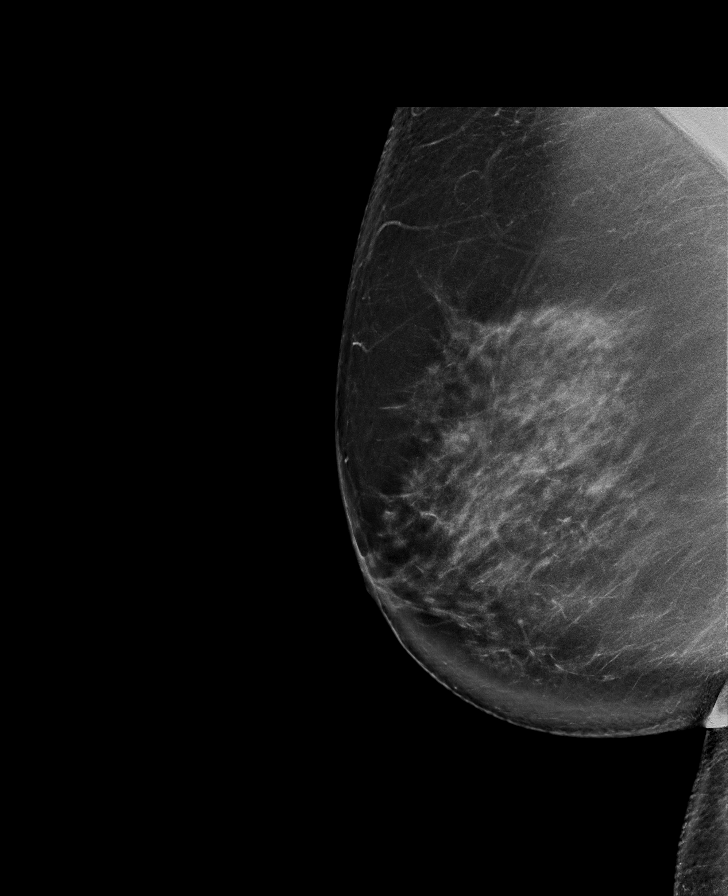

[L MLO synth-2D]
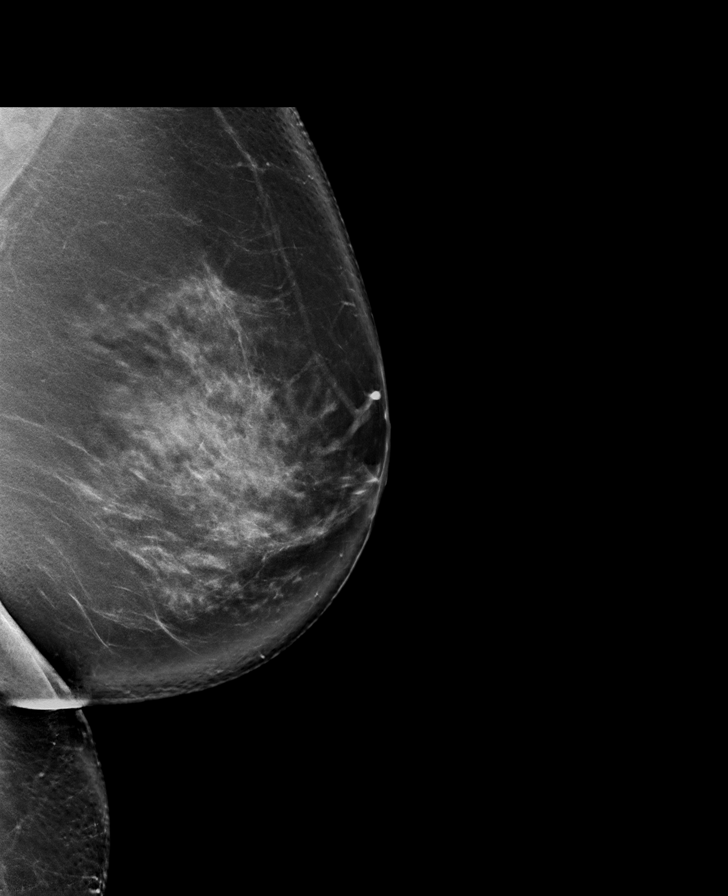

[R CC tomo · tomo slice 49/96.0]
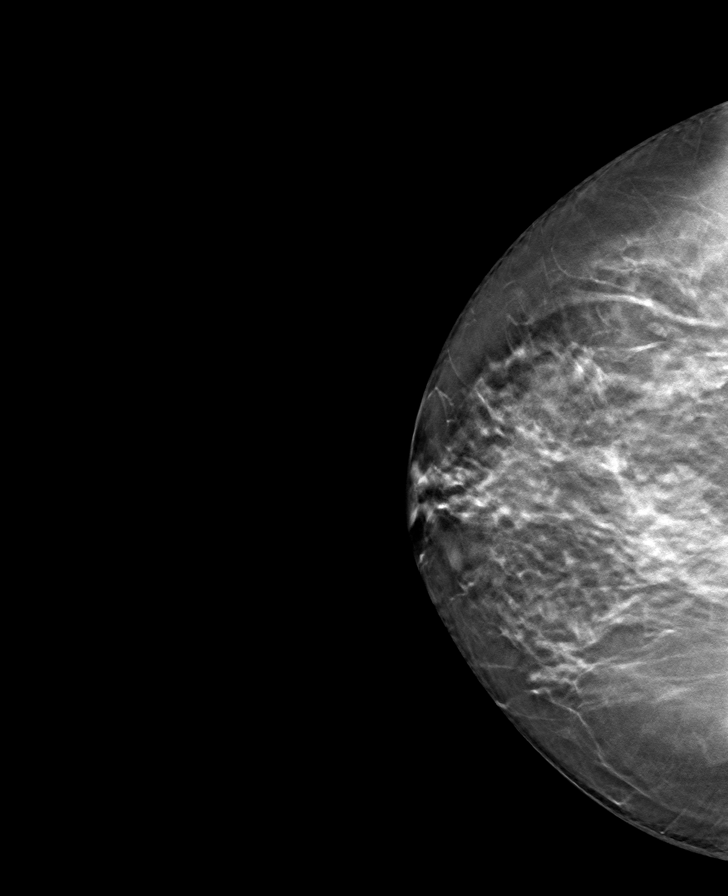

[R MLO tomo · tomo slice 55/109.0]
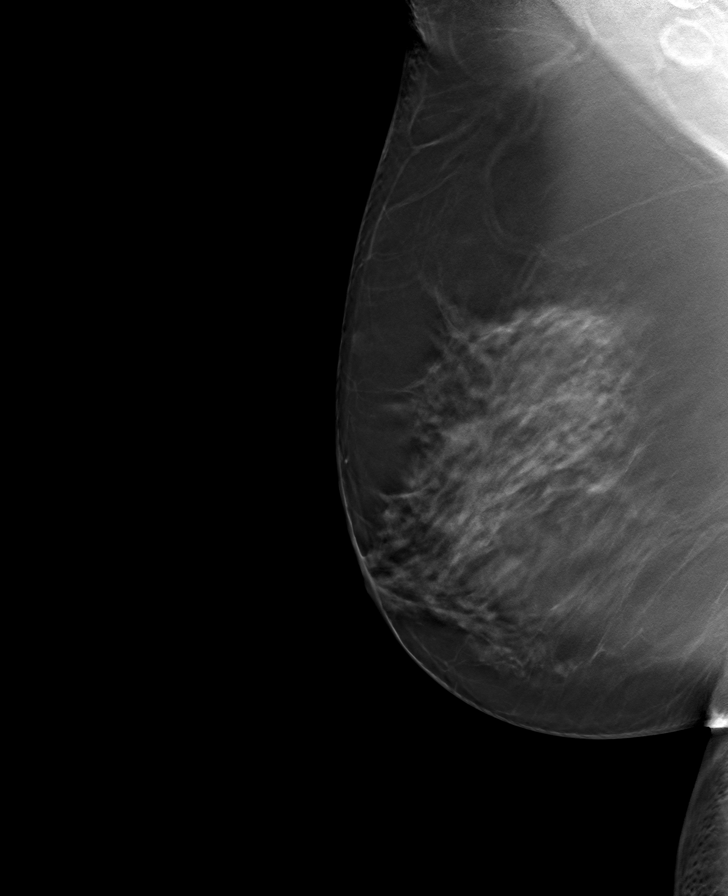

[L MLO tomo · tomo slice 57/113.0]
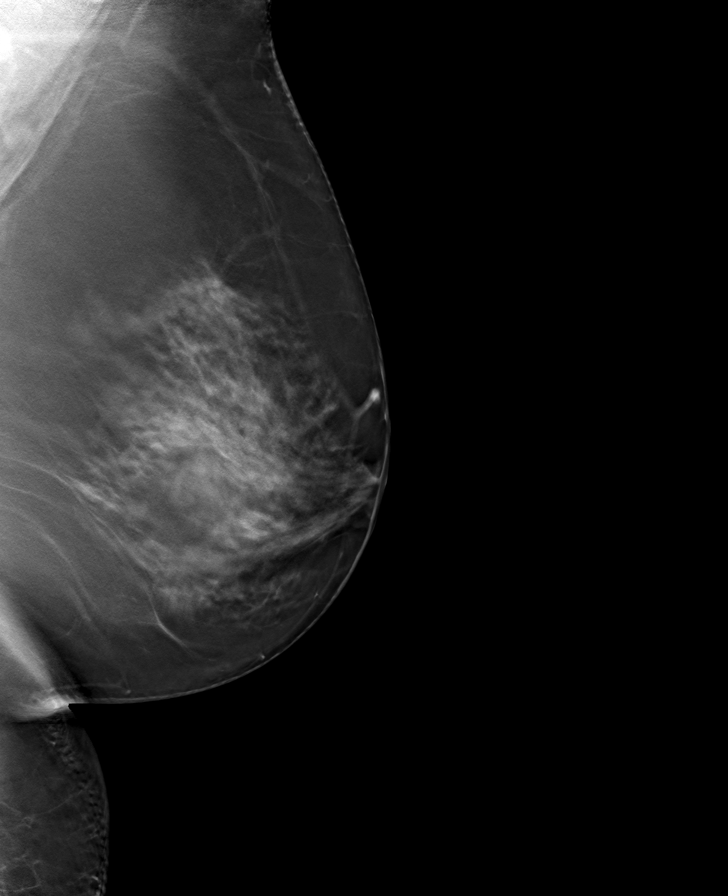

[L CC tomo · tomo slice 49/96.0]
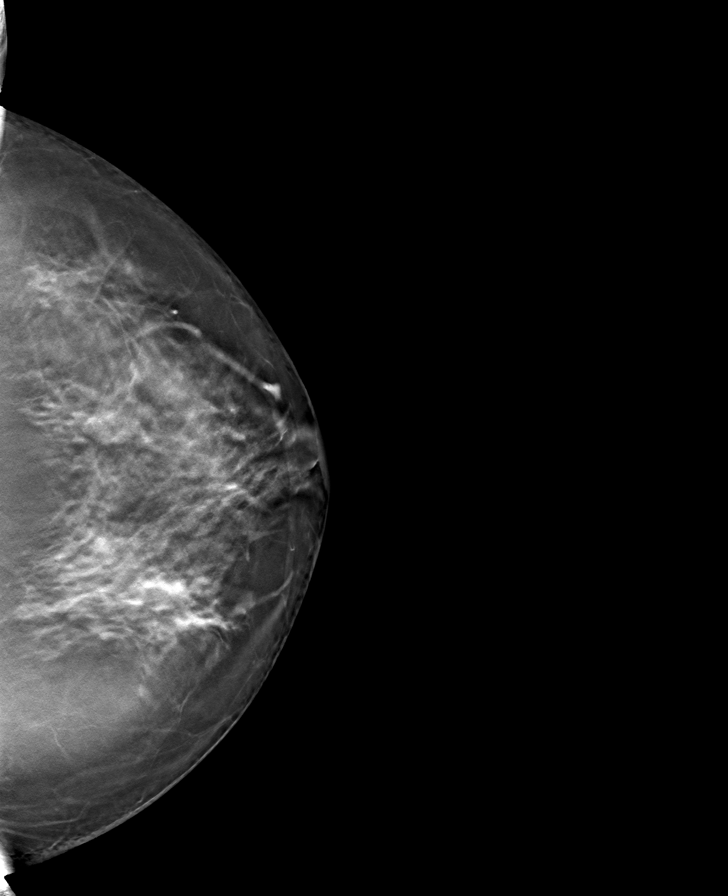

[8 of 24 positions shown; findings below may reference images not displayed]

ACR Breast Density Category c: The breast tissue is heterogeneously
dense, which may obscure small masses.
FINDINGS: There are no findings suspicious for malignancy. Images were
processed with CAD.
IMPRESSION: No mammographic evidence of malignancy. A result letter of this
screening mammogram will be mailed directly to the patient.

RECOMMENDATION:
Screening mammogram in one year. (Code:[id number])

BI-RADS CATEGORY  1: Negative.

## 2020-07-09 DIAGNOSIS — Z131 Encounter for screening for diabetes mellitus: Secondary | ICD-10-CM | POA: Diagnosis not present

## 2020-07-09 DIAGNOSIS — Z13220 Encounter for screening for lipoid disorders: Secondary | ICD-10-CM | POA: Diagnosis not present

## 2020-07-09 DIAGNOSIS — Z01419 Encounter for gynecological examination (general) (routine) without abnormal findings: Secondary | ICD-10-CM | POA: Diagnosis not present

## 2020-07-09 DIAGNOSIS — Z6825 Body mass index (BMI) 25.0-25.9, adult: Secondary | ICD-10-CM | POA: Diagnosis not present

## 2020-07-18 DIAGNOSIS — N3091 Cystitis, unspecified with hematuria: Secondary | ICD-10-CM | POA: Diagnosis not present

## 2020-07-18 DIAGNOSIS — N3001 Acute cystitis with hematuria: Secondary | ICD-10-CM | POA: Diagnosis not present

## 2020-09-04 DIAGNOSIS — H1033 Unspecified acute conjunctivitis, bilateral: Secondary | ICD-10-CM | POA: Diagnosis not present

## 2021-01-02 DIAGNOSIS — J019 Acute sinusitis, unspecified: Secondary | ICD-10-CM | POA: Diagnosis not present

## 2021-01-02 DIAGNOSIS — R051 Acute cough: Secondary | ICD-10-CM | POA: Diagnosis not present

## 2021-01-02 DIAGNOSIS — R0981 Nasal congestion: Secondary | ICD-10-CM | POA: Diagnosis not present

## 2021-03-02 DIAGNOSIS — J329 Chronic sinusitis, unspecified: Secondary | ICD-10-CM | POA: Diagnosis not present

## 2021-03-27 DIAGNOSIS — N3091 Cystitis, unspecified with hematuria: Secondary | ICD-10-CM | POA: Diagnosis not present

## 2021-03-27 DIAGNOSIS — R300 Dysuria: Secondary | ICD-10-CM | POA: Diagnosis not present

## 2021-03-27 DIAGNOSIS — R350 Frequency of micturition: Secondary | ICD-10-CM | POA: Diagnosis not present

## 2021-04-17 ENCOUNTER — Other Ambulatory Visit: Payer: Self-pay | Admitting: Family Medicine

## 2021-04-17 DIAGNOSIS — Z1231 Encounter for screening mammogram for malignant neoplasm of breast: Secondary | ICD-10-CM

## 2021-05-20 ENCOUNTER — Ambulatory Visit: Payer: BC Managed Care – PPO

## 2021-06-18 DIAGNOSIS — R051 Acute cough: Secondary | ICD-10-CM | POA: Diagnosis not present

## 2021-06-18 DIAGNOSIS — J019 Acute sinusitis, unspecified: Secondary | ICD-10-CM | POA: Diagnosis not present

## 2021-06-18 DIAGNOSIS — J209 Acute bronchitis, unspecified: Secondary | ICD-10-CM | POA: Diagnosis not present

## 2021-06-23 ENCOUNTER — Ambulatory Visit
Admission: RE | Admit: 2021-06-23 | Discharge: 2021-06-23 | Disposition: A | Discharge status: Home / Self Care | Payer: BC Managed Care – PPO | Source: Ambulatory Visit | Attending: Family Medicine | Admitting: Family Medicine

## 2021-06-23 DIAGNOSIS — Z1231 Encounter for screening mammogram for malignant neoplasm of breast: Secondary | ICD-10-CM | POA: Diagnosis not present

## 2021-07-23 DIAGNOSIS — Z1211 Encounter for screening for malignant neoplasm of colon: Secondary | ICD-10-CM | POA: Diagnosis not present

## 2021-07-23 DIAGNOSIS — Z131 Encounter for screening for diabetes mellitus: Secondary | ICD-10-CM | POA: Diagnosis not present

## 2021-07-23 DIAGNOSIS — Z01419 Encounter for gynecological examination (general) (routine) without abnormal findings: Secondary | ICD-10-CM | POA: Diagnosis not present

## 2021-07-23 DIAGNOSIS — Z6828 Body mass index (BMI) 28.0-28.9, adult: Secondary | ICD-10-CM | POA: Diagnosis not present

## 2021-07-23 DIAGNOSIS — E782 Mixed hyperlipidemia: Secondary | ICD-10-CM | POA: Diagnosis not present

## 2021-07-23 DIAGNOSIS — Z23 Encounter for immunization: Secondary | ICD-10-CM | POA: Diagnosis not present

## 2022-01-05 DIAGNOSIS — L821 Other seborrheic keratosis: Secondary | ICD-10-CM | POA: Diagnosis not present

## 2022-01-05 DIAGNOSIS — L814 Other melanin hyperpigmentation: Secondary | ICD-10-CM | POA: Diagnosis not present

## 2022-01-05 DIAGNOSIS — L603 Nail dystrophy: Secondary | ICD-10-CM | POA: Diagnosis not present

## 2022-02-16 DIAGNOSIS — L602 Onychogryphosis: Secondary | ICD-10-CM | POA: Diagnosis not present

## 2022-02-16 DIAGNOSIS — L603 Nail dystrophy: Secondary | ICD-10-CM | POA: Diagnosis not present

## 2022-02-16 DIAGNOSIS — B351 Tinea unguium: Secondary | ICD-10-CM | POA: Diagnosis not present

## 2022-03-20 DIAGNOSIS — M5416 Radiculopathy, lumbar region: Secondary | ICD-10-CM | POA: Diagnosis not present

## 2022-03-20 DIAGNOSIS — M549 Dorsalgia, unspecified: Secondary | ICD-10-CM | POA: Diagnosis not present

## 2022-07-29 DIAGNOSIS — B029 Zoster without complications: Secondary | ICD-10-CM | POA: Diagnosis not present

## 2022-07-29 DIAGNOSIS — R21 Rash and other nonspecific skin eruption: Secondary | ICD-10-CM | POA: Diagnosis not present

## 2022-08-30 DIAGNOSIS — M7122 Synovial cyst of popliteal space [Baker], left knee: Secondary | ICD-10-CM | POA: Diagnosis not present

## 2022-08-30 DIAGNOSIS — M25562 Pain in left knee: Secondary | ICD-10-CM | POA: Diagnosis not present

## 2022-11-12 DIAGNOSIS — N3001 Acute cystitis with hematuria: Secondary | ICD-10-CM | POA: Diagnosis not present

## 2022-11-12 DIAGNOSIS — N3090 Cystitis, unspecified without hematuria: Secondary | ICD-10-CM | POA: Diagnosis not present

## 2022-11-15 DIAGNOSIS — Z79899 Other long term (current) drug therapy: Secondary | ICD-10-CM | POA: Diagnosis not present

## 2022-11-15 DIAGNOSIS — B351 Tinea unguium: Secondary | ICD-10-CM | POA: Diagnosis not present

## 2022-12-18 DIAGNOSIS — R0981 Nasal congestion: Secondary | ICD-10-CM | POA: Diagnosis not present

## 2022-12-18 DIAGNOSIS — R509 Fever, unspecified: Secondary | ICD-10-CM | POA: Diagnosis not present

## 2022-12-18 DIAGNOSIS — R051 Acute cough: Secondary | ICD-10-CM | POA: Diagnosis not present

## 2022-12-18 DIAGNOSIS — R070 Pain in throat: Secondary | ICD-10-CM | POA: Diagnosis not present

## 2023-01-08 DIAGNOSIS — J209 Acute bronchitis, unspecified: Secondary | ICD-10-CM | POA: Diagnosis not present

## 2023-01-08 DIAGNOSIS — J0100 Acute maxillary sinusitis, unspecified: Secondary | ICD-10-CM | POA: Diagnosis not present

## 2023-02-25 ENCOUNTER — Other Ambulatory Visit: Payer: Self-pay | Admitting: Family Medicine

## 2023-02-25 DIAGNOSIS — Z0000 Encounter for general adult medical examination without abnormal findings: Secondary | ICD-10-CM

## 2023-02-28 DIAGNOSIS — L821 Other seborrheic keratosis: Secondary | ICD-10-CM | POA: Diagnosis not present

## 2023-02-28 DIAGNOSIS — D2271 Melanocytic nevi of right lower limb, including hip: Secondary | ICD-10-CM | POA: Diagnosis not present

## 2023-02-28 DIAGNOSIS — L814 Other melanin hyperpigmentation: Secondary | ICD-10-CM | POA: Diagnosis not present

## 2023-02-28 DIAGNOSIS — D225 Melanocytic nevi of trunk: Secondary | ICD-10-CM | POA: Diagnosis not present

## 2023-03-03 DIAGNOSIS — Z131 Encounter for screening for diabetes mellitus: Secondary | ICD-10-CM | POA: Diagnosis not present

## 2023-03-03 DIAGNOSIS — R7303 Prediabetes: Secondary | ICD-10-CM | POA: Diagnosis not present

## 2023-03-03 DIAGNOSIS — Z0000 Encounter for general adult medical examination without abnormal findings: Secondary | ICD-10-CM | POA: Diagnosis not present

## 2023-03-03 DIAGNOSIS — Z1331 Encounter for screening for depression: Secondary | ICD-10-CM | POA: Diagnosis not present

## 2023-03-03 DIAGNOSIS — E785 Hyperlipidemia, unspecified: Secondary | ICD-10-CM | POA: Diagnosis not present

## 2023-03-23 ENCOUNTER — Ambulatory Visit
Admission: RE | Admit: 2023-03-23 | Discharge: 2023-03-23 | Disposition: A | Discharge status: Home / Self Care | Payer: BC Managed Care – PPO | Source: Ambulatory Visit | Attending: Family Medicine | Admitting: Family Medicine

## 2023-03-23 DIAGNOSIS — Z1231 Encounter for screening mammogram for malignant neoplasm of breast: Secondary | ICD-10-CM | POA: Diagnosis not present

## 2023-03-23 DIAGNOSIS — Z0000 Encounter for general adult medical examination without abnormal findings: Secondary | ICD-10-CM

## 2023-09-03 DIAGNOSIS — R300 Dysuria: Secondary | ICD-10-CM | POA: Diagnosis not present

## 2023-09-03 DIAGNOSIS — N3090 Cystitis, unspecified without hematuria: Secondary | ICD-10-CM | POA: Diagnosis not present

## 2023-11-03 DIAGNOSIS — R8281 Pyuria: Secondary | ICD-10-CM | POA: Diagnosis not present

## 2023-11-03 DIAGNOSIS — R350 Frequency of micturition: Secondary | ICD-10-CM | POA: Diagnosis not present

## 2023-11-03 DIAGNOSIS — R300 Dysuria: Secondary | ICD-10-CM | POA: Diagnosis not present

## 2023-11-03 DIAGNOSIS — R319 Hematuria, unspecified: Secondary | ICD-10-CM | POA: Diagnosis not present

## 2023-11-03 DIAGNOSIS — M549 Dorsalgia, unspecified: Secondary | ICD-10-CM | POA: Diagnosis not present

## 2024-01-03 DIAGNOSIS — R350 Frequency of micturition: Secondary | ICD-10-CM | POA: Diagnosis not present

## 2024-01-03 DIAGNOSIS — N3000 Acute cystitis without hematuria: Secondary | ICD-10-CM | POA: Diagnosis not present

## 2024-01-03 DIAGNOSIS — R300 Dysuria: Secondary | ICD-10-CM | POA: Diagnosis not present

## 2024-01-24 DIAGNOSIS — M722 Plantar fascial fibromatosis: Secondary | ICD-10-CM | POA: Diagnosis not present

## 2024-03-08 DIAGNOSIS — Z1339 Encounter for screening examination for other mental health and behavioral disorders: Secondary | ICD-10-CM | POA: Diagnosis not present

## 2024-03-08 DIAGNOSIS — Z0000 Encounter for general adult medical examination without abnormal findings: Secondary | ICD-10-CM | POA: Diagnosis not present

## 2024-03-08 DIAGNOSIS — Z131 Encounter for screening for diabetes mellitus: Secondary | ICD-10-CM | POA: Diagnosis not present

## 2024-03-08 DIAGNOSIS — Z136 Encounter for screening for cardiovascular disorders: Secondary | ICD-10-CM | POA: Diagnosis not present

## 2024-03-08 DIAGNOSIS — R002 Palpitations: Secondary | ICD-10-CM | POA: Diagnosis not present

## 2024-03-08 DIAGNOSIS — Z1331 Encounter for screening for depression: Secondary | ICD-10-CM | POA: Diagnosis not present

## 2024-05-04 ENCOUNTER — Other Ambulatory Visit: Payer: Self-pay | Admitting: Family Medicine

## 2024-05-04 DIAGNOSIS — Z1231 Encounter for screening mammogram for malignant neoplasm of breast: Secondary | ICD-10-CM

## 2024-05-23 ENCOUNTER — Ambulatory Visit
Admission: RE | Admit: 2024-05-23 | Discharge: 2024-05-23 | Disposition: A | Discharge status: Home / Self Care | Source: Ambulatory Visit | Attending: Family Medicine | Admitting: Family Medicine

## 2024-05-23 DIAGNOSIS — Z1231 Encounter for screening mammogram for malignant neoplasm of breast: Secondary | ICD-10-CM
